# Patient Record
Sex: Male | Born: 2000
Health system: Southern US, Community
[De-identification: ages and names within clinical notes are randomized; demographics above are authoritative.]

## PROBLEM LIST (undated history)

## (undated) HISTORY — PX: NO PAST SURGERIES: SHX2092

---
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
## 2022-05-13 ENCOUNTER — Ambulatory Visit: Admit: 2022-05-13 | Payer: 59
